# Patient Record
Sex: Male | Born: 1975 | Race: Black or African American | Hispanic: No | Marital: Single | Smoking: Current every day smoker
Health system: Southern US, Community
[De-identification: ages and names within clinical notes are randomized; demographics above are authoritative.]

## PROBLEM LIST (undated history)

## (undated) DIAGNOSIS — T7840XA Allergy, unspecified, initial encounter: Secondary | ICD-10-CM

## (undated) DIAGNOSIS — F329 Major depressive disorder, single episode, unspecified: Secondary | ICD-10-CM

## (undated) DIAGNOSIS — F419 Anxiety disorder, unspecified: Secondary | ICD-10-CM

## (undated) DIAGNOSIS — F319 Bipolar disorder, unspecified: Secondary | ICD-10-CM

## (undated) DIAGNOSIS — F32A Depression, unspecified: Secondary | ICD-10-CM

## (undated) HISTORY — DX: Anxiety disorder, unspecified: F41.9

## (undated) HISTORY — DX: Major depressive disorder, single episode, unspecified: F32.9

## (undated) HISTORY — DX: Allergy, unspecified, initial encounter: T78.40XA

## (undated) HISTORY — DX: Depression, unspecified: F32.A

## (undated) HISTORY — DX: Bipolar disorder, unspecified: F31.9

---

## 2000-12-08 ENCOUNTER — Encounter: Payer: Self-pay | Admitting: Emergency Medicine

## 2000-12-08 ENCOUNTER — Emergency Department (HOSPITAL_COMMUNITY): Admission: EM | Admit: 2000-12-08 | Discharge: 2000-12-08 | Payer: Self-pay | Admitting: Emergency Medicine

## 2006-11-25 ENCOUNTER — Emergency Department (HOSPITAL_COMMUNITY): Admission: EM | Admit: 2006-11-25 | Discharge: 2006-11-25 | Payer: Self-pay | Admitting: Emergency Medicine

## 2010-12-04 ENCOUNTER — Emergency Department (HOSPITAL_COMMUNITY)
Admission: EM | Admit: 2010-12-04 | Discharge: 2010-12-04 | Disposition: A | Discharge status: Home / Self Care | Payer: Self-pay | Attending: Emergency Medicine | Admitting: Emergency Medicine

## 2010-12-04 DIAGNOSIS — F17200 Nicotine dependence, unspecified, uncomplicated: Secondary | ICD-10-CM | POA: Insufficient documentation

## 2010-12-04 DIAGNOSIS — T622X1A Toxic effect of other ingested (parts of) plant(s), accidental (unintentional), initial encounter: Secondary | ICD-10-CM | POA: Insufficient documentation

## 2010-12-04 DIAGNOSIS — M2550 Pain in unspecified joint: Secondary | ICD-10-CM

## 2010-12-04 DIAGNOSIS — L237 Allergic contact dermatitis due to plants, except food: Secondary | ICD-10-CM

## 2010-12-04 DIAGNOSIS — M2540 Effusion, unspecified joint: Secondary | ICD-10-CM | POA: Insufficient documentation

## 2010-12-04 DIAGNOSIS — L255 Unspecified contact dermatitis due to plants, except food: Secondary | ICD-10-CM | POA: Insufficient documentation

## 2010-12-04 MED ORDER — PREDNISONE 10 MG PO TABS: 20.0000 mg | ORAL_TABLET | Freq: Every day | ORAL | Status: AC

## 2010-12-04 MED ORDER — DIPHENHYDRAMINE HCL 25 MG PO CAPS
25.0000 mg | ORAL_CAPSULE | Freq: Once | ORAL | Status: AC
  Administered 2010-12-04: 25 mg via ORAL
  Filled 2010-12-04 (×2): qty 1

## 2010-12-04 MED ORDER — PREDNISONE 20 MG PO TABS
60.0000 mg | ORAL_TABLET | Freq: Once | ORAL | Status: AC
  Administered 2010-12-04: 60 mg via ORAL
  Filled 2010-12-04 (×2): qty 3

## 2010-12-04 NOTE — ED Provider Notes (Signed)
History     CSN: [id number] Arrival date & time: 12/04/2010 12:22 AM  Chief Complaint  Patient presents with  . Generalized Body Aches  . Joint Swelling  . Rash    x 1 week   HPI Comments: Seen 0122.  Patient is a 35 y.o. male presenting with rash. The history is provided by the patient.  Rash  This is a new (Patient working outdoors with exporusre to poison oak/ivy. Developed a rash which was associated with lesions and itching. Has subsequently developed joint tenderness and swelling to wrists, hands, feet. ) problem. The problem has been gradually worsening. The problem is associated with plant contact. There has been no fever. Affected Location: antecubital area on the left, back. The pain is mild. Associated symptoms include blisters, itching and pain.    History reviewed. No pertinent past medical history.  History reviewed. No pertinent past surgical history.  History reviewed. No pertinent family history.  History  Substance Use Topics  . Smoking status: Current Everyday Smoker    Types: Cigarettes  . Smokeless tobacco: Not on file  . Alcohol Use: Yes     occ      Review of Systems  Skin: Positive for itching and rash.  All other systems reviewed and are negative.    Physical Exam  BP 131/79  Pulse 86  Temp(Src) 98.3 F (36.8 C) (Oral)  Resp 20  Ht 6' (1.829 m)  Wt 167 lb (75.751 kg)  BMI 22.65 kg/m2  SpO2 100%  Physical Exam  Nursing note and vitals reviewed. Constitutional: He is oriented to person, place, and time. He appears well-developed and well-nourished. No distress.  HENT:  Head: Normocephalic and atraumatic.  Eyes: EOM are normal.  Neck: Normal range of motion. Neck supple.  Cardiovascular: Normal rate, normal heart sounds and intact distal pulses.   Pulmonary/Chest: Effort normal and breath sounds normal.  Musculoskeletal: Normal range of motion.  Neurological: He is alert and oriented to person, place, and time. He has normal  reflexes.  Skin: Skin is warm and dry.       Resolving rash to left antecubital region. Excoriations to left back. No open weeping lesions. Mild tenderness with RON to wrists, ankles  Psychiatric: He has a normal mood and affect.    ED Course  Procedures  Patient with recent exposure to poison ivy/oak, resolving skin lesions and subsequent  joint tenderness and swelling. Initiated treatment with prednisone. Patient  informed of clinical course, understand medical decision-making process, and agree with plan.Pt stable in ED with no significant deterioration in condition. MDM Reviewed: nursing note and vitals        Nicoletta Dress. Colon Branch, MD 12/04/10 [phone]

## 2010-12-04 NOTE — ED Notes (Signed)
Rash that comes and goes for 1 week, joint swelling that comes and goes for 1 week,

## 2012-06-06 ENCOUNTER — Encounter (HOSPITAL_COMMUNITY): Payer: Self-pay | Admitting: *Deleted

## 2012-06-06 ENCOUNTER — Emergency Department (HOSPITAL_COMMUNITY)
Admission: EM | Admit: 2012-06-06 | Discharge: 2012-06-06 | Disposition: A | Discharge status: Home / Self Care | Payer: Self-pay | Attending: Emergency Medicine | Admitting: Emergency Medicine

## 2012-06-06 DIAGNOSIS — H6692 Otitis media, unspecified, left ear: Secondary | ICD-10-CM

## 2012-06-06 DIAGNOSIS — H6690 Otitis media, unspecified, unspecified ear: Secondary | ICD-10-CM | POA: Insufficient documentation

## 2012-06-06 DIAGNOSIS — F1210 Cannabis abuse, uncomplicated: Secondary | ICD-10-CM | POA: Insufficient documentation

## 2012-06-06 DIAGNOSIS — H9210 Otorrhea, unspecified ear: Secondary | ICD-10-CM | POA: Insufficient documentation

## 2012-06-06 DIAGNOSIS — F17200 Nicotine dependence, unspecified, uncomplicated: Secondary | ICD-10-CM | POA: Insufficient documentation

## 2012-06-06 MED ORDER — AZITHROMYCIN 250 MG PO TABS: ORAL_TABLET | ORAL | Status: DC

## 2012-06-06 NOTE — ED Notes (Signed)
Pt c/o left ear pain since Saturday. Pt denies drainage from ear. Pain becomes worse when laying down. Redness inside ear noted during assessment.

## 2012-06-06 NOTE — ED Provider Notes (Signed)
Medical screening examination/treatment/procedure(s) were performed by non-physician practitioner and as supervising physician I was immediately available for consultation/collaboration.   Joseph L Zammit, MD 06/06/12 1436 

## 2012-06-06 NOTE — ED Provider Notes (Signed)
History     CSN: [id number]  Arrival date & time 06/06/12  1205   First MD Initiated Contact with Patient 06/06/12 1216      Chief Complaint  Patient presents with  . Otalgia    HPI KODY BRANDL is a 37 y.o. male who presents to the ED with ear pain. The pain started 3 days ago. He describes the pain as      History reviewed. No pertinent past medical history.  History reviewed. No pertinent past surgical history.  History reviewed. No pertinent family history.  History  Substance Use Topics  . Smoking status: Current Every Day Smoker    Types: Cigarettes  . Smokeless tobacco: Not on file  . Alcohol Use: Yes     Comment: occ      Review of Systems  Constitutional: Negative for fever and chills.  HENT: Positive for ear pain and congestion. Negative for sore throat, facial swelling and neck pain.   Respiratory: Negative for cough and wheezing.   Cardiovascular: Negative for chest pain.  Gastrointestinal: Negative for nausea, vomiting and abdominal pain.  Genitourinary: Negative for dysuria.  Musculoskeletal: Negative for back pain.  Skin: Negative for rash.  Neurological: Negative for headaches.  Psychiatric/Behavioral: Negative for confusion. The patient is not nervous/anxious.     Allergies  Penicillins  Home Medications  No current outpatient prescriptions on file.  BP 124/82  Pulse 76  Temp(Src) 98.2 F (36.8 C) (Oral)  Resp 18  Ht 6' (1.829 m)  Wt 160 lb (72.576 kg)  BMI 21.7 kg/m2  SpO2 100%  Physical Exam  Nursing note and vitals reviewed. Constitutional: He is oriented to person, place, and time. He appears well-developed and well-nourished. No distress.  HENT:  Head: Normocephalic and atraumatic.  Right Ear: Tympanic membrane normal.  Left Ear: Ear canal normal. Tympanic membrane is erythematous and bulging.  Mouth/Throat: Uvula is midline, oropharynx is clear and moist and mucous membranes are normal.  Eyes: EOM are normal. Pupils are  equal, round, and reactive to light.  Neck: Neck supple.  Cardiovascular: Normal rate and regular rhythm.   Pulmonary/Chest: Effort normal and breath sounds normal.  Abdominal: Soft. There is no tenderness. There is no CVA tenderness.  Musculoskeletal: Normal range of motion. He exhibits no edema.  Neurological: He is alert and oriented to person, place, and time. No cranial nerve deficit.  Skin: Skin is warm and dry.  Psychiatric: He has a normal mood and affect. His behavior is normal. Judgment and thought content normal.   Procedures  Assessment: 37 y.o. male with ear pain   Otitis media left  Plan:  Antibiotics   Follow up with PCP, ibuprofen for discomfort Discussed with the patient and all questioned fully answered.    Medication List    TAKE these medications       azithromycin 250 MG tablet  Commonly known as:  ZITHROMAX  Take 2 tablets now and then one tablet daily            Janne Napoleon, NP 06/06/12 1246

## 2012-06-06 NOTE — ED Notes (Signed)
Pt c/o left earache since Saturday, denies sore throat, fever, cough or N/v/D

## 2012-12-01 ENCOUNTER — Ambulatory Visit (HOSPITAL_COMMUNITY): Payer: Self-pay | Admitting: Psychology

## 2013-01-16 ENCOUNTER — Ambulatory Visit (INDEPENDENT_AMBULATORY_CARE_PROVIDER_SITE_OTHER): Payer: BC Managed Care – PPO | Admitting: Family Medicine

## 2013-01-16 ENCOUNTER — Encounter: Payer: Self-pay | Admitting: Family Medicine

## 2013-01-16 VITALS — BP 120/88 | HR 78 | Temp 98.4°F | Resp 18 | Ht 69.0 in | Wt 161.0 lb

## 2013-01-16 DIAGNOSIS — F319 Bipolar disorder, unspecified: Secondary | ICD-10-CM

## 2013-01-16 DIAGNOSIS — G5691 Unspecified mononeuropathy of right upper limb: Secondary | ICD-10-CM

## 2013-01-16 DIAGNOSIS — Z23 Encounter for immunization: Secondary | ICD-10-CM

## 2013-01-16 DIAGNOSIS — F32A Depression, unspecified: Secondary | ICD-10-CM

## 2013-01-16 DIAGNOSIS — G4700 Insomnia, unspecified: Secondary | ICD-10-CM

## 2013-01-16 DIAGNOSIS — F329 Major depressive disorder, single episode, unspecified: Secondary | ICD-10-CM

## 2013-01-16 DIAGNOSIS — F17200 Nicotine dependence, unspecified, uncomplicated: Secondary | ICD-10-CM

## 2013-01-16 DIAGNOSIS — F411 Generalized anxiety disorder: Secondary | ICD-10-CM | POA: Insufficient documentation

## 2013-01-16 DIAGNOSIS — G5690 Unspecified mononeuropathy of unspecified upper limb: Secondary | ICD-10-CM

## 2013-01-16 DIAGNOSIS — F341 Dysthymic disorder: Secondary | ICD-10-CM

## 2013-01-16 DIAGNOSIS — F419 Anxiety disorder, unspecified: Secondary | ICD-10-CM

## 2013-01-16 MED ORDER — TEMAZEPAM 15 MG PO CAPS: 15.0000 mg | ORAL_CAPSULE | Freq: Every evening | ORAL | Status: DC | PRN

## 2013-01-16 MED ORDER — FLUOXETINE HCL 20 MG PO TABS: 20.0000 mg | ORAL_TABLET | Freq: Every day | ORAL | Status: DC

## 2013-01-16 MED ORDER — LORAZEPAM 0.5 MG PO TABS: 0.5000 mg | ORAL_TABLET | Freq: Two times a day (BID) | ORAL | Status: DC | PRN

## 2013-01-16 NOTE — Assessment & Plan Note (Signed)
Restart temazepam at bedtime 

## 2013-01-16 NOTE — Assessment & Plan Note (Signed)
Start prozac and ativan prn

## 2013-01-16 NOTE — Assessment & Plan Note (Signed)
Discussed tobacco cessation, trying e cig again as he did well with this Flu shot given

## 2013-01-16 NOTE — Assessment & Plan Note (Signed)
Unfortunately we have no way to obtain records, I did discuss with pharmacy his meds He was never on any mood stabilizers  Will start low dose SSRI for his anxiety/depression symptoms

## 2013-01-16 NOTE — Patient Instructions (Addendum)
Start medications as prescribed Take sleeping pill 1 hour before bedtime Start prozac 20 mg  Take ativan .5mg  twice a day as needed F/U 6 weeks

## 2013-01-16 NOTE — Assessment & Plan Note (Signed)
I think he may have some early carpal tunnel, discussed bracing he wants to defer at this time We will monitor for now if this progresses conduction studies

## 2013-01-16 NOTE — Progress Notes (Signed)
  Subjective:    Patient ID: Billy Graham, male    DOB: [birth year], 37 y.o.   MRN: [id number]  HPI  Pt here to establish care. No previous PCP. Was seen by psychiatry Dr. Carroll Sage about 8 months ago, however she closed her practice. Was treated for "explosive Bipolar, anxiety, depression and insomnia" per patient. He was on temazepam, ativan, flexeril for sleep and mood. He was also on a medication given as samples but could not afford and pharmacy has no record of this. He recently split from his wife, told his mood swings were the cause. He feels depressed at times then anxious and angry. He works full time and job is not affected. His sleep is very poor even though he works first shift.  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 Denies any rehab for substances or mood.   Tingling/numbbness in right hand- mostly thumb and index finger sometimes radiates to forearm. No specific injury. Symptoms on and off for the past month. 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 Denies dropping any items.   Review of Systems - per above  GEN- denies fatigue, fever, weight loss,weakness, recent illness HEENT- denies eye drainage, change in vision, nasal discharge, CVS- denies chest pain, palpitations RESP- denies SOB, cough, wheeze ABD- denies N/V, change in stools, abd pain GU- denies dysuria, hematuria, dribbling, incontinence MSK- denies joint pain, muscle aches, injury Neuro- denies headache, dizziness, syncope, seizure activity      Objective:   Physical Exam  GEN- NAD, alert and oriented x3 HEENT- PERRL, EOMI, non injected sclera, pink conjunctiva, MMM, oropharynx clear Neck- Supple, FROM, neg spurlings CVS- RRR, no murmur RESP-CTAB MSK- FROM upper ext, normal inspection, strength equal bilat Neuro- motor equal bilat UE, DTR symmetric, sensation grossly in tact, neg phalens, neg tinels EXT- No  edema Pulses- Radial 2+ PSYCH- normal affect and mood, no hallucinations, good eye contact        Assessment & Plan:

## 2013-02-27 ENCOUNTER — Encounter: Payer: Self-pay | Admitting: Family Medicine

## 2013-02-27 ENCOUNTER — Ambulatory Visit (INDEPENDENT_AMBULATORY_CARE_PROVIDER_SITE_OTHER): Payer: BC Managed Care – PPO | Admitting: Family Medicine

## 2013-02-27 ENCOUNTER — Ambulatory Visit: Payer: BC Managed Care – PPO | Admitting: Family Medicine

## 2013-02-27 VITALS — BP 110/80 | HR 68 | Temp 97.7°F | Resp 18 | Ht 70.5 in | Wt 162.0 lb

## 2013-02-27 DIAGNOSIS — F32A Depression, unspecified: Secondary | ICD-10-CM

## 2013-02-27 DIAGNOSIS — G4700 Insomnia, unspecified: Secondary | ICD-10-CM

## 2013-02-27 DIAGNOSIS — F329 Major depressive disorder, single episode, unspecified: Secondary | ICD-10-CM

## 2013-02-27 DIAGNOSIS — F341 Dysthymic disorder: Secondary | ICD-10-CM

## 2013-02-27 MED ORDER — FLUOXETINE HCL 40 MG PO CAPS: 40.0000 mg | ORAL_CAPSULE | Freq: Every day | ORAL | Status: DC

## 2013-02-27 MED ORDER — TEMAZEPAM 30 MG PO CAPS: 30.0000 mg | ORAL_CAPSULE | Freq: Every evening | ORAL | Status: DC | PRN

## 2013-02-27 MED ORDER — ALPRAZOLAM 0.5 MG PO TABS: 0.5000 mg | ORAL_TABLET | Freq: Two times a day (BID) | ORAL | Status: DC | PRN

## 2013-02-27 NOTE — Progress Notes (Signed)
  Subjective:    Patient ID: Billy Graham, male    DOB: July 15, 1975, 37 y.o.   MRN: [id number]  HPI Patient here for six-week interim followup on medications. He has a history of anxiety and depression as well as possible underlying bipolar disorder. He was started on Prozac 20 mg he states this is helping he continues to have difficulties with anxiety. He was also given Restoril for sleep which she has used in the past as well as Ativan. His sleep has not improved he does not feel like the medication is strong enough. Regarding his anxiety attacks and anger issues the Ativan which he was on in the past did not help at all.   Review of Systems - per above  GEN- denies fatigue, fever, weight loss,weakness, recent illness       Objective:   Physical Exam  GEN-NAD,alert and oriented x 3 Psych- normal affect and mood PHQ-9 -- 7 GAD -7 12     Assessment & Plan:

## 2013-02-27 NOTE — Assessment & Plan Note (Addendum)
Increase Prozac to 40 mg day Will change him to Xanax 0.5 mg twice a day and see how he does with this as we titrate up his Prozac

## 2013-02-27 NOTE — Assessment & Plan Note (Signed)
Increases temazepam to 30 mg another option will be trazodone or seroquel

## 2013-02-27 NOTE — Patient Instructions (Signed)
Prozac increasaed to 40mg  Change to xanax 0.5t wice a day as needed Temazepam increased to 30mg  ( for sleep) F/U 8 weeks

## 2013-03-19 ENCOUNTER — Telehealth: Payer: Self-pay | Admitting: Family Medicine

## 2013-03-19 NOTE — Telephone Encounter (Signed)
noted 

## 2013-03-19 NOTE — Telephone Encounter (Signed)
Pt ran out of xanax 3 days early and wanted to let us know   Call back [phone]

## 2013-04-27 ENCOUNTER — Telehealth: Payer: Self-pay | Admitting: Family Medicine

## 2013-04-27 MED ORDER — TEMAZEPAM 30 MG PO CAPS: 30.0000 mg | ORAL_CAPSULE | Freq: Every evening | ORAL | Status: DC | PRN

## 2013-04-27 MED ORDER — ALPRAZOLAM 0.5 MG PO TABS: 0.5000 mg | ORAL_TABLET | Freq: Two times a day (BID) | ORAL | Status: DC | PRN

## 2013-04-27 NOTE — Telephone Encounter (Signed)
RX called in .

## 2013-04-27 NOTE — Telephone Encounter (Signed)
Okay to refill? 

## 2013-04-27 NOTE — Telephone Encounter (Signed)
Pt is needing a refill on his Restoril and Xanax Call back number is [phone]

## 2013-04-27 NOTE — Telephone Encounter (Signed)
?   Ok to refill;last rf 03/29/13;lov11/25/14

## 2013-06-20 ENCOUNTER — Other Ambulatory Visit: Payer: Self-pay | Admitting: Family Medicine

## 2013-06-20 NOTE — Telephone Encounter (Signed)
Give 30 day supply only needs an appointment before any further refills

## 2013-06-20 NOTE — Telephone Encounter (Signed)
Medication called to pharmacy. 

## 2013-06-20 NOTE — Telephone Encounter (Signed)
Ok to refill??  Last office visit 112/24/2014.  Last refill 04/27/2013.

## 2013-07-10 ENCOUNTER — Ambulatory Visit (INDEPENDENT_AMBULATORY_CARE_PROVIDER_SITE_OTHER): Payer: BC Managed Care – PPO | Admitting: Family Medicine

## 2013-07-10 ENCOUNTER — Encounter: Payer: Self-pay | Admitting: Family Medicine

## 2013-07-10 VITALS — BP 118/60 | HR 64 | Temp 98.2°F | Resp 12 | Ht 72.0 in | Wt 171.0 lb

## 2013-07-10 DIAGNOSIS — Z0000 Encounter for general adult medical examination without abnormal findings: Secondary | ICD-10-CM | POA: Insufficient documentation

## 2013-07-10 DIAGNOSIS — G4700 Insomnia, unspecified: Secondary | ICD-10-CM

## 2013-07-10 DIAGNOSIS — F419 Anxiety disorder, unspecified: Secondary | ICD-10-CM

## 2013-07-10 DIAGNOSIS — F319 Bipolar disorder, unspecified: Secondary | ICD-10-CM

## 2013-07-10 DIAGNOSIS — Z111 Encounter for screening for respiratory tuberculosis: Secondary | ICD-10-CM

## 2013-07-10 DIAGNOSIS — Z0184 Encounter for antibody response examination: Secondary | ICD-10-CM

## 2013-07-10 DIAGNOSIS — F341 Dysthymic disorder: Secondary | ICD-10-CM

## 2013-07-10 DIAGNOSIS — F32A Depression, unspecified: Secondary | ICD-10-CM

## 2013-07-10 DIAGNOSIS — F17200 Nicotine dependence, unspecified, uncomplicated: Secondary | ICD-10-CM

## 2013-07-10 DIAGNOSIS — F329 Major depressive disorder, single episode, unspecified: Secondary | ICD-10-CM

## 2013-07-10 DIAGNOSIS — Z23 Encounter for immunization: Secondary | ICD-10-CM

## 2013-07-10 LAB — CBC WITH DIFFERENTIAL/PLATELET
Basophils Absolute: 0 10*3/uL (ref 0.0–0.1)
Basophils Relative: 0 % (ref 0–1)
EOS ABS: 0.2 10*3/uL (ref 0.0–0.7)
Eosinophils Relative: 4 % (ref 0–5)
HCT: 41.6 % (ref 39.0–52.0)
HEMOGLOBIN: 14.1 g/dL (ref 13.0–17.0)
LYMPHS ABS: 1.9 10*3/uL (ref 0.7–4.0)
LYMPHS PCT: 31 % (ref 12–46)
MCH: 29.6 pg (ref 26.0–34.0)
MCHC: 33.9 g/dL (ref 30.0–36.0)
MCV: 87.2 fL (ref 78.0–100.0)
MONOS PCT: 9 % (ref 3–12)
Monocytes Absolute: 0.6 10*3/uL (ref 0.1–1.0)
NEUTROS PCT: 56 % (ref 43–77)
Neutro Abs: 3.5 10*3/uL (ref 1.7–7.7)
PLATELETS: 213 10*3/uL (ref 150–400)
RBC: 4.77 MIL/uL (ref 4.22–5.81)
RDW: 15.3 % (ref 11.5–15.5)
WBC: 6.2 10*3/uL (ref 4.0–10.5)

## 2013-07-10 LAB — LIPID PANEL
CHOL/HDL RATIO: 3.3 ratio
CHOLESTEROL: 161 mg/dL (ref 0–200)
HDL: 49 mg/dL (ref >39)
LDL Cholesterol: 101 mg/dL — ABNORMAL HIGH (ref 0–99)
Triglycerides: 53 mg/dL (ref <150)
VLDL: 11 mg/dL (ref 0–40)

## 2013-07-10 LAB — COMPREHENSIVE METABOLIC PANEL
ALBUMIN: 4.1 g/dL (ref 3.5–5.2)
ALK PHOS: 47 U/L (ref 39–117)
ALT: 24 U/L (ref 0–53)
AST: 17 U/L (ref 0–37)
BUN: 9 mg/dL (ref 6–23)
CO2: 26 mEq/L (ref 19–32)
CREATININE: 0.86 mg/dL (ref 0.50–1.35)
Calcium: 9.2 mg/dL (ref 8.4–10.5)
Chloride: 107 mEq/L (ref 96–112)
GLUCOSE: 80 mg/dL (ref 70–99)
POTASSIUM: 4.1 meq/L (ref 3.5–5.3)
Sodium: 140 mEq/L (ref 135–145)
Total Bilirubin: 0.7 mg/dL (ref 0.2–1.2)
Total Protein: 6.3 g/dL (ref 6.0–8.3)

## 2013-07-10 NOTE — Assessment & Plan Note (Signed)
Continue to work on tobacco cessation.  

## 2013-07-10 NOTE — Assessment & Plan Note (Signed)
Doing well on meds, no changes

## 2013-07-10 NOTE — Progress Notes (Signed)
Patient ID: Billy ChangChristopher S Graham, male   DOB: 12-Jan-1976, 38 y.o.   MRN: [id number]   Subjective:    Patient ID: Billy Changhristopher S Graham, male    DOB: 12-Jan-1976, 38 y.o.   MRN: [id number]  Patient presents for F/U from Nov  patient here for physical exam. He also has some forms for his work. He will be working for [employer] [place] and needs immunization titers as well as PPD placement and tetanus booster. He is no specific concerns. He is taking his medications as prescribed and he feels much better with his mood. He is sleeping fairly well with the temazepam. He states that his mood has been good with the Prozac and his wife notices a difference.  He is due for fasting labs.    Review Of Systems:  GEN- denies fatigue, fever, weight loss,weakness, recent illness HEENT- denies eye drainage, change in vision, nasal discharge, CVS- denies chest pain, palpitations RESP- denies SOB, cough, wheeze ABD- denies N/V, change in stools, abd pain GU- denies dysuria, hematuria, dribbling, incontinence MSK- denies joint pain, muscle aches, injury Neuro- denies headache, dizziness, syncope, seizure activity       Objective:    BP 118/60  Pulse 64  Temp(Src) 98.2 F (36.8 C) (Oral)  Resp 12  Ht 6' (1.829 m)  Wt 171 lb (77.565 kg)  BMI 23.19 kg/m2 GEN- NAD, alert and oriented x3 HEENT- PERRL, EOMI, non injected sclera, pink conjunctiva, MMM, oropharynx clear, TM clear bilat Neck- Supple, CVS- RRR, no murmur RESP-CTAB ABD-NABS,soft,NT,ND EXT- No edema Pulses- Radial, DP- 2+ MSK- FROM Upper and Lower EXT Neuro- CNII-XII in tact, no focal deficits, DTR symmetric , normal muscle tone Psych- normal affect and mood       Assessment & Plan:      Problem List Items Addressed This Visit   None      Note: This dictation was prepared with Dragon dictation along with smaller phrase technology. Any transcriptional errors that result from this process are unintentional.

## 2013-07-10 NOTE — Patient Instructions (Signed)
We will call with results Return in 48 hours to have PPD read by nurse Medications refilled F/U 4 months

## 2013-07-10 NOTE — Assessment & Plan Note (Signed)
Mood has been stable, he is not on a mood stabilizer, does not appear manic with use of SSRI

## 2013-07-10 NOTE — Assessment & Plan Note (Signed)
Stable on meds, no change 

## 2013-07-10 NOTE — Assessment & Plan Note (Signed)
CPE done, TDAP given, PPD for work Titers drawn for immunity status FLP

## 2013-07-11 ENCOUNTER — Other Ambulatory Visit: Payer: Self-pay | Admitting: Family Medicine

## 2013-07-11 LAB — VARICELLA ZOSTER ANTIBODY, IGG: Varicella IgG: 3595 Index — ABNORMAL HIGH (ref <135.00)

## 2013-07-11 LAB — MEASLES/MUMPS/RUBELLA IMMUNITY
MUMPS IGG: 179 [AU]/ml — AB (ref <9.00)
RUBEOLA IGG: 27.3 [AU]/ml — AB (ref <25.00)
Rubella: 1.75 Index — ABNORMAL HIGH (ref <0.90)

## 2013-07-11 LAB — HEPATITIS B SURFACE ANTIBODY,QUALITATIVE: Hep B S Ab: NEGATIVE

## 2013-07-12 ENCOUNTER — Ambulatory Visit: Payer: BC Managed Care – PPO | Admitting: *Deleted

## 2013-07-12 DIAGNOSIS — Z111 Encounter for screening for respiratory tuberculosis: Secondary | ICD-10-CM

## 2013-07-12 LAB — TB SKIN TEST
INDURATION: 0 mm
TB Skin Test: NEGATIVE

## 2013-07-12 NOTE — Telephone Encounter (Signed)
Ok to refill??  Last office visit 07/10/2013.  Last refill 06/20/2013.

## 2013-07-12 NOTE — Progress Notes (Signed)
Patient ID: Billy Graham, male   DOB: January 03, 1976, 38 y.o.   MRN: [id number] Patient seen in clinic today to have TB skin test read.   Noted test site negative.

## 2013-07-13 NOTE — Telephone Encounter (Signed)
Okay to refill, change to 60 tablets, give 2 refills

## 2013-07-13 NOTE — Telephone Encounter (Signed)
Medication called to pharmacy.  Call placed to patient and patient made aware.  

## 2013-09-09 ENCOUNTER — Other Ambulatory Visit: Payer: Self-pay | Admitting: Family Medicine

## 2013-09-10 NOTE — Telephone Encounter (Signed)
Medication called to pharmacy. 

## 2013-09-10 NOTE — Telephone Encounter (Signed)
Ok to refill??  Last office visit 07/10/2013.  Last refill 04/27/2013.

## 2013-09-10 NOTE — Telephone Encounter (Signed)
Okay to refill? 

## 2013-10-08 ENCOUNTER — Other Ambulatory Visit: Payer: Self-pay | Admitting: Family Medicine

## 2013-10-08 NOTE — Telephone Encounter (Signed)
Okay to refill? 

## 2013-10-08 NOTE — Telephone Encounter (Signed)
Medication called to pharmacy. 

## 2013-10-08 NOTE — Telephone Encounter (Signed)
Ok to refill??  Last office visit 07/10/2013.  Last refill 07/13/2013, #2 refills.

## 2013-11-09 ENCOUNTER — Ambulatory Visit: Payer: BC Managed Care – PPO | Admitting: Family Medicine

## 2013-12-03 ENCOUNTER — Ambulatory Visit: Payer: BC Managed Care – PPO | Admitting: Family Medicine

## 2013-12-25 ENCOUNTER — Encounter: Payer: Self-pay | Admitting: Family Medicine

## 2013-12-25 ENCOUNTER — Ambulatory Visit (INDEPENDENT_AMBULATORY_CARE_PROVIDER_SITE_OTHER): Payer: BC Managed Care – PPO | Admitting: Family Medicine

## 2013-12-25 VITALS — BP 118/70 | HR 78 | Temp 98.2°F | Resp 16 | Ht 72.44 in | Wt 177.0 lb

## 2013-12-25 DIAGNOSIS — J309 Allergic rhinitis, unspecified: Secondary | ICD-10-CM | POA: Insufficient documentation

## 2013-12-25 DIAGNOSIS — F329 Major depressive disorder, single episode, unspecified: Secondary | ICD-10-CM

## 2013-12-25 DIAGNOSIS — F341 Dysthymic disorder: Secondary | ICD-10-CM

## 2013-12-25 DIAGNOSIS — G4700 Insomnia, unspecified: Secondary | ICD-10-CM

## 2013-12-25 DIAGNOSIS — F319 Bipolar disorder, unspecified: Secondary | ICD-10-CM

## 2013-12-25 DIAGNOSIS — B353 Tinea pedis: Secondary | ICD-10-CM | POA: Insufficient documentation

## 2013-12-25 DIAGNOSIS — J3089 Other allergic rhinitis: Secondary | ICD-10-CM

## 2013-12-25 DIAGNOSIS — F32A Depression, unspecified: Secondary | ICD-10-CM

## 2013-12-25 DIAGNOSIS — Z23 Encounter for immunization: Secondary | ICD-10-CM

## 2013-12-25 DIAGNOSIS — F419 Anxiety disorder, unspecified: Principal | ICD-10-CM

## 2013-12-25 MED ORDER — FLUTICASONE PROPIONATE 50 MCG/ACT NA SUSP: 2.0000 | Freq: Every day | NASAL | Status: DC

## 2013-12-25 MED ORDER — TEMAZEPAM 30 MG PO CAPS: ORAL_CAPSULE | ORAL | Status: DC

## 2013-12-25 MED ORDER — ALPRAZOLAM 0.5 MG PO TABS: ORAL_TABLET | ORAL | Status: DC

## 2013-12-25 MED ORDER — FLUOXETINE HCL 40 MG PO CAPS: 40.0000 mg | ORAL_CAPSULE | Freq: Every day | ORAL | Status: DC

## 2013-12-25 MED ORDER — TERBINAFINE HCL 250 MG PO TABS: 250.0000 mg | ORAL_TABLET | Freq: Every day | ORAL | Status: DC

## 2013-12-25 NOTE — Assessment & Plan Note (Signed)
His mood is well-controlled when he is on his Prozac all restart this along with the Xanax which he did well with this combination. He has not required any hospitalizations. He has some support from his family with regards to the passing of his father

## 2013-12-25 NOTE — Progress Notes (Signed)
Patient ID: Billy Graham, male   DOB: 10-21-1975, 38 y.o.   MRN: [id number]   Subjective:    Patient ID: Billy Graham, male    DOB: [birth year], 38 y.o.   MRN: [id number]  Patient presents for L Ear Infection and Medication Review and Refill  patient here to followup medications. He was being treated for anxiety as well as bipolar disorder and insomnia. He's been off this medication for about the past 3-1/2 weeks. His job to take him out of town very often therefore he missed his last appointment. He has had some increased stress recently her father passed away in Dec 24, 2022 for metastatic cancer. His family has taken is very hard.  He complains of sinus drainage but denies any color to the drainage is mostly down the back of his throat. He also gets some sneezing with it and some ear discomfort. He's not had any fever or significant cough.  Peeling skin on his feet which is been there for quite some time he is also concerned about the color of his nails they've always been dark and this is been present for years. He has tried over-the-counter Lamisil and Lotrimin without any improvement in the scaliness of his feet    Review Of Systems:  GEN- denies fatigue, fever, weight loss,weakness, recent illness HEENT- denies eye drainage, change in vision, nasal discharge, CVS- denies chest pain, palpitations RESP- denies SOB, cough, wheeze ABD- denies N/V, change in stools, abd pain GU- denies dysuria, hematuria, dribbling, incontinence MSK- denies joint pain, muscle aches, injury Neuro- denies headache, dizziness, syncope, seizure activity       Objective:    BP 118/70  Pulse 78  Temp(Src) 98.2 F (36.8 C) (Oral)  Resp 16  Ht 6' 0.44" (1.84 m)  Wt 177 lb (80.287 kg)  BMI 23.71 kg/m2 GEN- NAD, alert and oriented x3 HEENT- PERRL, EOMI, non injected sclera, pink conjunctiva, MMM, oropharynx clear, nares clear rhinorrhea, enlarged turbinates, TM clear bialt canal clear CVS- RRR, no  murmur RESP-CTAB Psych- normal affect and mood Skin- dry scaley skin on heels and lateral aspects of feet, mild moisture between web spaces, hyperpigmented stria in right great toenail and 5th nail, no yellow discoloration no brittle nail EXT- No edema Pulses- Radial, DP- 2+        Assessment & Plan:      Problem List Items Addressed This Visit   Tinea pedis   Relevant Medications      terbinafine (LAMISIL) 250 MG tablet   Insomnia   Bipolar disorder, unspecified   Anxiety and depression   Allergic rhinitis - Primary    Other Visit Diagnoses   Need for prophylactic vaccination and inoculation against influenza           Note: This dictation was prepared with Dragon dictation along with smaller phrase technology. Any transcriptional errors that result from this process are unintentional.

## 2013-12-25 NOTE — Assessment & Plan Note (Signed)
Restart temazepam at bedtime

## 2013-12-25 NOTE — Patient Instructions (Addendum)
Restart medications Flu shot given Use nasal spray for the sinus drainage Take pill for athletes foot as directed, use gold bonds powder F/U 4 months

## 2013-12-25 NOTE — Assessment & Plan Note (Signed)
We'll treat with Flonase 

## 2013-12-25 NOTE — Assessment & Plan Note (Signed)
Failed topical treatments we'll try him on terbinafine once a day for the next 2 weeks regarding the hyperpigmented nails I think this is due to the hyperpigmentation of his skin there is no sign of any fungus

## 2014-03-06 ENCOUNTER — Ambulatory Visit: Payer: BC Managed Care – PPO | Admitting: Family Medicine

## 2014-03-26 ENCOUNTER — Other Ambulatory Visit: Payer: Self-pay | Admitting: Family Medicine

## 2014-03-26 NOTE — Telephone Encounter (Signed)
Medication called to pharmacy. 

## 2014-03-26 NOTE — Telephone Encounter (Signed)
Ok to refill??  Last office visit/ refill 12/25/2013, #2 refills.

## 2014-03-26 NOTE — Telephone Encounter (Signed)
Okay to refill? 

## 2014-03-30 ENCOUNTER — Other Ambulatory Visit: Payer: Self-pay | Admitting: Family Medicine

## 2014-03-31 NOTE — Telephone Encounter (Signed)
Ok to refill?  Last CMP 07/10/2013.

## 2014-04-29 ENCOUNTER — Ambulatory Visit: Payer: Self-pay | Admitting: Family Medicine

## 2014-04-29 ENCOUNTER — Encounter: Payer: Self-pay | Admitting: Family Medicine

## 2014-05-31 ENCOUNTER — Telehealth: Payer: Self-pay | Admitting: Family Medicine

## 2014-05-31 NOTE — Telephone Encounter (Signed)
Patient is calling to get bills for hisself, he said that his employer is supposed to be paying them  [phone]

## 2014-06-05 ENCOUNTER — Encounter: Payer: Self-pay | Admitting: Family Medicine

## 2014-06-05 NOTE — Telephone Encounter (Signed)
I have left msg for patient to return my call.

## 2014-06-21 ENCOUNTER — Telehealth: Payer: Self-pay | Admitting: Family Medicine

## 2014-06-21 NOTE — Telephone Encounter (Signed)
He has been stable on meds, okay to refill for him, he has contract work

## 2014-06-21 NOTE — Telephone Encounter (Signed)
Call placed to patient.   Last OV noted in 12/2013.  Reports that his medication is effective, and he does not want to change anything. Reports that [place] will be working in [place] year, and will only get 1-2 days where he will be able to return to town to try to schedule appointment.   Requested that when patient requires refill that he contact office with the name and phone number of pharmacy [facility]   MD to be made aware.

## 2014-06-21 NOTE — Telephone Encounter (Signed)
[phone]  PT has called he is in town this weekend. He will be out of town working for a year and he is wanting to come in and see dr Jeanice Limdurham. I told him that she did not have anything available today and that I would send a message back to her nurse to see if there was anything that could be done. He states that he does want to speak to the nurse or dr Jeanice Limdurham about his medications.

## 2014-06-24 NOTE — Telephone Encounter (Signed)
Tried calling patient no answer. I did look at his guarantor snapshot and I don't see that patient has any balance. I will wait for patient to return my call.

## 2014-07-19 ENCOUNTER — Other Ambulatory Visit: Payer: Self-pay | Admitting: Family Medicine

## 2014-07-19 NOTE — Telephone Encounter (Signed)
LRF Restoril 12/25/13 #30 + 3  LRF Alprazolam 03/26/14 #60 + 2  LOV 12/25/13  OK refill?

## 2014-07-19 NOTE — Telephone Encounter (Signed)
Okay to refill? 

## 2014-07-22 NOTE — Telephone Encounter (Signed)
rx's called in . 

## 2014-09-13 ENCOUNTER — Other Ambulatory Visit: Payer: Self-pay | Admitting: Family Medicine

## 2014-09-16 NOTE — Telephone Encounter (Signed)
Medication filled x1 with no refills.   Requires office visit before any further refills can be given.   Letter sent.  

## 2015-03-19 ENCOUNTER — Encounter: Payer: Self-pay | Admitting: Family Medicine

## 2015-03-19 ENCOUNTER — Ambulatory Visit (INDEPENDENT_AMBULATORY_CARE_PROVIDER_SITE_OTHER): Payer: BLUE CROSS/BLUE SHIELD | Admitting: Family Medicine

## 2015-03-19 VITALS — BP 128/62 | HR 74 | Temp 98.5°F | Resp 18 | Ht 72.0 in | Wt 179.0 lb

## 2015-03-19 DIAGNOSIS — Z23 Encounter for immunization: Secondary | ICD-10-CM

## 2015-03-19 DIAGNOSIS — G4700 Insomnia, unspecified: Secondary | ICD-10-CM

## 2015-03-19 DIAGNOSIS — F418 Other specified anxiety disorders: Secondary | ICD-10-CM

## 2015-03-19 DIAGNOSIS — F32A Depression, unspecified: Secondary | ICD-10-CM

## 2015-03-19 DIAGNOSIS — F329 Major depressive disorder, single episode, unspecified: Secondary | ICD-10-CM

## 2015-03-19 DIAGNOSIS — F419 Anxiety disorder, unspecified: Principal | ICD-10-CM

## 2015-03-19 MED ORDER — ALPRAZOLAM 0.5 MG PO TABS: 0.5000 mg | ORAL_TABLET | Freq: Two times a day (BID) | ORAL | Status: DC | PRN

## 2015-03-19 MED ORDER — FLUOXETINE HCL 40 MG PO CAPS: 40.0000 mg | ORAL_CAPSULE | Freq: Every day | ORAL | Status: DC

## 2015-03-19 MED ORDER — TEMAZEPAM 30 MG PO CAPS: ORAL_CAPSULE | ORAL | Status: DC

## 2015-03-19 NOTE — Addendum Note (Signed)
Addended by: Phillips OdorSIX, Alizay Bronkema H on: 03/19/2015 03:06 PM   Modules accepted: Orders

## 2015-03-19 NOTE — Patient Instructions (Signed)
Take Multivitamin Take Vitamin C  Delsym for cough  Vicks for any congestion  Continue current medication F/U 1 year for Physical

## 2015-03-19 NOTE — Progress Notes (Signed)
Patient ID: Billy Graham, male   DOB: 08-May-1975, 39 y.o.   MRN: [id number]   Subjective:    Patient ID: Billy Graham, male    DOB: 08-May-1975, 39 y.o.   MRN: [id number]  Patient presents for Medicaiton Review/ Refill He does a lot of contract work  In [place]- [place] was sent to [place] [employer] now he is back brace for for the next 6 months but they're planning to send [place] to [place] long[place]term. He has history of anxiety and depression as well as insomnia. He was also given a diagnosis of bipolar in the past. His lungs he is on his medications his symptoms are very well controlled. He does admit that with the holidays and the multiple jobs that he is working on that he's had some problems focusing but overall is doing very well on the job. He has no new concerns today. He did ask when he could take as needed for cough and congestion when he comes up.    Review Of Systems:  GEN- denies fatigue, fever, weight loss,weakness, recent illness HEENT- denies eye drainage, change in vision, nasal discharge, CVS- denies chest pain, palpitations RESP- denies SOB, cough, wheeze ABD- denies N/V, change in stools, abd pain GU- denies dysuria, hematuria, dribbling, incontinence MSK- denies joint pain, muscle aches, injury Neuro- denies headache, dizziness, syncope, seizure activity       Objective:    BP 128/62 mmHg  Pulse 74  Temp(Src) 98.5 F (36.9 C) (Oral)  Resp 18  Ht 6' (1.829 m)  Wt 179 lb (81.194 kg)  BMI 24.27 kg/m2 GEN- NAD, alert and oriented x3 HEENT- PERRL, EOMI, non injected sclera, pink conjunctiva, MMM, oropharynx clear CVS- RRR, no murmur RESP-CTAB Psych- normal affect and mood EXT- No edema Pulses- Radial - 2+        Assessment & Plan:      Problem List Items Addressed This Visit    Insomnia   Anxiety and depression - Primary      Note: This dictation was prepared with Dragon dictation along with smaller phrase technology. Any transcriptional  errors that result from this process are unintentional.

## 2015-03-19 NOTE — Assessment & Plan Note (Addendum)
We'll continue his Prozac as well as temazepam for sleep and Xanax as needed. No signs of any abuse of the medication. His symptoms are very well controlled with this regimen. 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 I would not recommend adding any other medications in such as stimulants. 15 minutes with patient greater than 50% spent on counseling and medication management

## 2015-04-23 ENCOUNTER — Ambulatory Visit (INDEPENDENT_AMBULATORY_CARE_PROVIDER_SITE_OTHER): Payer: BLUE CROSS/BLUE SHIELD | Admitting: Physician Assistant

## 2015-04-23 ENCOUNTER — Encounter: Payer: Self-pay | Admitting: Physician Assistant

## 2015-04-23 VITALS — BP 134/90 | HR 64 | Temp 98.2°F | Resp 18 | Wt 179.0 lb

## 2015-04-23 DIAGNOSIS — J029 Acute pharyngitis, unspecified: Secondary | ICD-10-CM | POA: Diagnosis not present

## 2015-04-23 DIAGNOSIS — H66002 Acute suppurative otitis media without spontaneous rupture of ear drum, left ear: Secondary | ICD-10-CM | POA: Diagnosis not present

## 2015-04-23 MED ORDER — AZITHROMYCIN 250 MG PO TABS: ORAL_TABLET | ORAL | Status: DC

## 2015-04-23 NOTE — Progress Notes (Signed)
    Patient ID: Billy Graham MRN: [id number], DOB: 12-14-1975, 40 y.o. Date of Encounter: 04/23/2015, 12:07 PM    Chief Complaint:  Chief Complaint  Patient presents with  . sick x 3 days    sore throat, ear ache, body aches     HPI: 40 y.o. year old AA male presents with above symptoms. Says that on Sunday (04/20/15) he vomited about 3 times. Says that he has not vomited anymore that he has continued to feel little nauseous. Also feels like he has some heartburn. Has had no diarrhea. Also he says that his left ear has been hurting with a really bad achy pain for 3 days.  Also now with sore throat and says he just feels weak in general. Says that he is not really getting much drainage out of his nose and not much congestion in his chest or cough. No known fevers or chills.     Home Meds:   Outpatient Prescriptions Prior to Visit  Medication Sig Dispense Refill  . ALPRAZolam (XANAX) 0.5 MG tablet Take 1 tablet (0.5 mg total) by mouth 2 (two) times daily as needed. 60 tablet 3  . FLUoxetine (PROZAC) 40 MG capsule Take 1 capsule (40 mg total) by mouth daily. 30 capsule 11  . temazepam (RESTORIL) 30 MG capsule TAKE 1 CAPSULE BY MOUTH EVERY DAY AT BEDTIME- MUST WAIT 30 DAYS BETWEEN REFILLS 30 capsule 5   No facility-administered medications prior to visit.    Allergies:  Allergies  Allergen Reactions  . Penicillins     Can't remember reaction      Review of Systems: See HPI for pertinent ROS. All other ROS negative.    Physical Exam: Blood pressure 134/90, pulse 64, temperature 98.2 F (36.8 C), temperature source Oral, resp. rate 18, weight 179 lb (81.194 kg)., Body mass index is 24.27 kg/(m^2). General:  WNWD AAM. Appears in no acute distress. HEENT: Normocephalic, atraumatic, eyes without discharge, sclera non-icteric, nares are without discharge. Bilateral auditory canals clear, TM's are without perforation. Left TM with diffuse red erythema. Right TM clear.  Oral  cavity moist, posterior pharynx with mild erythema, no exudate, no peritonsillar abscess.  Neck: Supple. No thyromegaly. No lymphadenopathy. Lungs: Clear bilaterally to auscultation without wheezes, rales, or rhonchi. Breathing is unlabored. Heart: Regular rhythm. No murmurs, rubs, or gallops. Abdomen: Normal bowel sounds. No area of significant tenderness with palpation. No mass or organomegaly. Msk:  Strength and tone normal for age. Extremities/Skin: Warm and dry. Neuro: Alert and oriented X 3. Moves all extremities spontaneously. Gait is normal. CNII-XII grossly in tact. Psych:  Responds to questions appropriately with a normal affect.   Rapid strep test ---Negative   ASSESSMENT AND PLAN:  40 y.o. year old male with  1. Acute suppurative otitis media of left ear without spontaneous rupture of tympanic membrane, recurrence not specified Penicillin allergy. Therefore we'll treat with azithromycin. He is to take azithromycin as directed. Started as immediately as possible. Can use Tylenol and Motrin for pain relief. Follow-up if symptoms not improved in 48 hours or if they do not resolve within 1 week after completion of antibiotic. - azithromycin (ZITHROMAX) 250 MG tablet; Day 1: Take 2 daily. Days 2-5: Take 1 daily.  Dispense: 6 tablet; Refill: 0  2. Sorethroat - Rapid strep screen (not at [facility])   Signed, [facility] [place], [place], [facility] 04/23/2015 12:07 PM

## 2015-05-07 LAB — STREP GROUP A AG, W/REFLEX TO CULT: STREGTOCOCCUS GROUP A AG SCREEN: NOT DETECTED

## 2015-05-07 NOTE — Addendum Note (Signed)
Addended by: Alean Rinne A on: 05/07/2015 03:48 PM   Modules accepted: Orders

## 2015-05-30 ENCOUNTER — Telehealth: Payer: Self-pay | Admitting: Family Medicine

## 2015-05-30 NOTE — Telephone Encounter (Signed)
Called pt told [employer].  He asked for appt Tuesday morning, he will be in [place] then.

## 2015-05-30 NOTE — Telephone Encounter (Signed)
I did not see him, is he able to come in for a recheck on the ear? This afternoon.  If not try Sudafed to decongest, add Omnicef  BID x 7 days

## 2015-05-30 NOTE — Telephone Encounter (Signed)
Ear is still very bothersome and vertigo is bad.  Ear not painful but feels plugged and is popping.  Feels he needs something else.  Please advise?

## 2015-06-03 ENCOUNTER — Ambulatory Visit (INDEPENDENT_AMBULATORY_CARE_PROVIDER_SITE_OTHER): Payer: BLUE CROSS/BLUE SHIELD | Admitting: Family Medicine

## 2015-06-03 ENCOUNTER — Encounter: Payer: Self-pay | Admitting: Family Medicine

## 2015-06-03 VITALS — BP 128/70 | HR 68 | Temp 98.3°F | Resp 14 | Ht 72.0 in | Wt 178.0 lb

## 2015-06-03 DIAGNOSIS — H6692 Otitis media, unspecified, left ear: Secondary | ICD-10-CM

## 2015-06-03 DIAGNOSIS — M25462 Effusion, left knee: Secondary | ICD-10-CM | POA: Diagnosis not present

## 2015-06-03 MED ORDER — MELOXICAM 7.5 MG PO TABS: 7.5000 mg | ORAL_TABLET | Freq: Every day | ORAL | Status: DC

## 2015-06-03 MED ORDER — CEFDINIR 300 MG PO CAPS
300.0000 mg | ORAL_CAPSULE | Freq: Two times a day (BID) | ORAL | Status: DC
Start: 2015-06-03 — End: 2020-02-19

## 2015-06-03 NOTE — Patient Instructions (Signed)
Take meloxicam for 2 weeks, with food Take antibiotics for infection Use zyrtec or sudafed  F/U as needed

## 2015-06-03 NOTE — Progress Notes (Signed)
Patient ID: Billy Graham, male   DOB: [birth year], 40 y.o.   MRN: [id number]   Subjective:    Patient ID: Billy Graham, male    DOB: February 28, 1976, 39 y.o.   MRN: [id number]  Patient presents for L Ear Issues and L Knee Edema  Patient here to follow-up left ear otitis media with no sign of rupture of tympanic membrane on 04/23/2015 at that time he was described azithromycin as he also had concurrent sore throat. He called back on 224 with continued ear pain and some vertigo-like symptoms I recommended that he use Sudafed. He tried Sudafed for a few days as well as some Tylenol sinus and congestion medicine. He still has some popping discomfort in his left ear he does not feel like it cleared up completely.  The past 4 days he's had swelling in his left knee. He does not recall any particular injury. He works in [employer] and he states that he was on his knees on Friday putting up some type of plastic barrier which is something he does not typically do but he did not have any pain or discomfort while he was doing it. He did use some type of topical anti-inflammatory and the swelling has gone down significantly the past few days.      Review Of Systems:  GEN- denies fatigue, fever, weight loss,weakness, recent illness HEENT- denies eye drainage, change in vision, nasal discharge, CVS- denies chest pain, palpitations RESP- denies SOB, cough, wheeze ABD- denies N/V, change in stools, abd pain GU- denies dysuria, hematuria, dribbling, incontinence MSK- +joint pain, muscle aches, injury Neuro- denies headache, dizziness, syncope, seizure activity       Objective:    BP 128/70 mmHg  Pulse 68  Temp(Src) 98.3 F (36.8 C) (Oral)  Resp 14  Ht 6' (1.829 m)  Wt 178 lb (80.74 kg)  BMI 24.14 kg/m2 GEN- NAD, alert and oriented x3 HEENT- PERRL, EOMI, non injected sclera, pink conjunctiva, MMM, oropharynx clear, Right canal and TM clear, Left TM injected, no fluid, canal clear, no bulge   Neck- Supple, no LAD  CVS- RRR, no murmur RESP-CTAB MSK- Right knee- normal inspection FROM, left knee- mild effusion inferior knee, NT, Good ROM, ligaments in tact, normal weight bearing EXT- No edema Pulses- Radial,  2+        Assessment & Plan:      Problem List Items Addressed This Visit    None      Note: This dictation was prepared with Dragon dictation along with smaller phrase technology. Any transcriptional errors that result from this process are unintentional.

## 2015-06-24 ENCOUNTER — Telehealth: Payer: Self-pay | Admitting: Family Medicine

## 2015-06-24 DIAGNOSIS — M7989 Other specified soft tissue disorders: Secondary | ICD-10-CM

## 2015-06-24 NOTE — Telephone Encounter (Signed)
Send for xray of right knee

## 2015-06-24 NOTE — Telephone Encounter (Signed)
Call placed to patient to inquire.   Reports that edema did resolve during 2 week trial of Mobic. Reports that once he stopped taking Mobic, edema returned. States that L knee is swollen and stiff, but is not painful. States that he has resumed taking Mobic, but edema continues.   MD please advise.

## 2015-06-24 NOTE — Telephone Encounter (Signed)
Call placed to patient and patient made aware.   X-ray Order placed for L knee.  Patient will obtain X-ray on 06/25/2015.

## 2015-06-24 NOTE — Telephone Encounter (Signed)
Patient calling to say that his knee is still swollen would like to know what he should do  Please call him at [phone]

## 2015-06-25 ENCOUNTER — Other Ambulatory Visit: Payer: Self-pay | Admitting: *Deleted

## 2015-06-25 ENCOUNTER — Ambulatory Visit (HOSPITAL_COMMUNITY)
Admission: RE | Admit: 2015-06-25 | Discharge: 2015-06-25 | Disposition: A | Discharge status: Home / Self Care | Payer: BLUE CROSS/BLUE SHIELD | Source: Ambulatory Visit | Attending: Family Medicine | Admitting: Family Medicine

## 2015-06-25 DIAGNOSIS — M7989 Other specified soft tissue disorders: Secondary | ICD-10-CM | POA: Diagnosis not present

## 2015-06-25 DIAGNOSIS — M25462 Effusion, left knee: Secondary | ICD-10-CM

## 2015-10-10 ENCOUNTER — Other Ambulatory Visit: Payer: Self-pay | Admitting: Family Medicine

## 2015-10-13 NOTE — Telephone Encounter (Signed)
Ok to refill??  Last office visit 06/03/2015.  Last refill Xanax 03/19/2015, #3 refills.   Last refill Restoril 03/19/2015, #5 refills.

## 2015-10-14 NOTE — Telephone Encounter (Signed)
Medication called to pharmacy. 

## 2015-10-14 NOTE — Telephone Encounter (Signed)
okay

## 2015-12-25 ENCOUNTER — Ambulatory Visit (INDEPENDENT_AMBULATORY_CARE_PROVIDER_SITE_OTHER): Payer: BLUE CROSS/BLUE SHIELD | Admitting: Physician Assistant

## 2015-12-25 ENCOUNTER — Encounter: Payer: Self-pay | Admitting: Physician Assistant

## 2015-12-25 VITALS — BP 120/80 | HR 69 | Temp 98.3°F | Resp 16 | Wt 166.0 lb

## 2015-12-25 DIAGNOSIS — J988 Other specified respiratory disorders: Secondary | ICD-10-CM | POA: Diagnosis not present

## 2015-12-25 DIAGNOSIS — B9689 Other specified bacterial agents as the cause of diseases classified elsewhere: Principal | ICD-10-CM

## 2015-12-25 MED ORDER — CEFDINIR 300 MG PO CAPS: 300.0000 mg | ORAL_CAPSULE | Freq: Two times a day (BID) | ORAL | 0 refills | Status: DC

## 2015-12-25 NOTE — Progress Notes (Signed)
    Patient ID: Billy Graham MRN: [id number], DOB: 1975-04-19, 40 y.o. Date of Encounter: 12/25/2015, 1:01 PM    Chief Complaint:  Chief Complaint  Patient presents with  . Otalgia    congested, left ear pain, muscle soreness going on about 2 weeks      HPI: 40 y.o. year old male presents with above.   Says that he this has been going on about 2 weeks. Says that he kept thinking that it would get better on its own--- so put off coming in--- but symptoms are not improving. Now has started to have some left ear ache. Also has been blowing a lot from his nose and also coughing up phlegm from his chest. Has had no fevers or chills. No other complaints or concerns.     Home Meds:   Outpatient Medications Prior to Visit  Medication Sig Dispense Refill  . ALPRAZolam (XANAX) 0.5 MG tablet TAKE 1 TABLET BY MOUTH TWICE DAILY AS NEEDED FOR ANXIETY 60 tablet 3  . FLUoxetine (PROZAC) 40 MG capsule Take 1 capsule (40 mg total) by mouth daily. 30 capsule 11  . temazepam (RESTORIL) 30 MG capsule TAKE 1 CAPSULE BY MOUTH EVERY NIGHT AT BEDTIME 30 capsule 5  . cefdinir (OMNICEF) 300 MG capsule Take 1 capsule (300 mg total) by mouth 2 (two) times daily. (Patient not taking: Reported on 12/25/2015) 14 capsule 0  . meloxicam (MOBIC) 7.5 MG tablet Take 1 tablet (7.5 mg total) by mouth daily. 30 tablet 0   No facility-administered medications prior to visit.     Allergies:  Allergies  Allergen Reactions  . Penicillins     Can't remember reaction      Review of Systems: See HPI for pertinent ROS. All other ROS negative.    Physical Exam: Blood pressure 120/80, pulse 69, temperature 98.3 F (36.8 C), temperature source Oral, resp. rate 16, weight 166 lb (75.3 kg)., Body mass index is 22.51 kg/m. General:  WNWD AAM. Appears in no acute distress. HEENT: Normocephalic, atraumatic, eyes without discharge, sclera non-icteric, nares are without discharge. Bilateral auditory canals clear. Right TM  normal. Left TM with erythema. No perforation.  Oral cavity moist, posterior pharynx without exudate, erythema, peritonsillar abscess. Neck: Supple. No thyromegaly. No lymphadenopathy. Lungs: Clear bilaterally to auscultation without wheezes, rales, or rhonchi. Breathing is unlabored. Heart: Regular rhythm. No murmurs, rubs, or gallops. Msk:  Strength and tone normal for age. Extremities/Skin: Warm and dry.Neuro: Alert and oriented X 3. Moves all extremities spontaneously. Gait is normal. CNII-XII grossly in tact. Psych:  Responds to questions appropriately with a normal affect.     ASSESSMENT AND PLAN:  40 y.o. year old male with  1. Bacterial respiratory infection He states in the past Z-Pak did not work for him. I reviewed his chart from visits in 05/2015. At that time he was changed to [facility] and that worked so will use Omnicef at this time. Take the [facility] as directed and complete all of it. Follow-up if symptoms do not resolve after completion of his antibiotic. - cefdinir (OMNICEF) 300 MG capsule; Take 1 capsule (300 mg total) by mouth 2 (two) times daily.  Dispense: 14 capsule; Refill: 0   Signed, [address], [place], [facility] 12/25/2015 1:01 PM

## 2016-02-04 ENCOUNTER — Telehealth: Payer: Self-pay | Admitting: *Deleted

## 2016-02-04 NOTE — Telephone Encounter (Signed)
Received call from patient.   Reports that he has been seen for L ear pain in Sept. States that he was given Omnicef for viral URI. States that he completed ABTx and pain lessened.   Reports that pain has returned to L ear and now has moved to R ear as well. Reports that he is also having some hearing loss in B ears due to pressure.   Patient states that he is [facility] working [facility] [employer]. [facility]

## 2016-03-26 ENCOUNTER — Other Ambulatory Visit: Payer: Self-pay | Admitting: Family Medicine

## 2016-03-26 NOTE — Telephone Encounter (Signed)
Okay to refill? 

## 2016-03-26 NOTE — Telephone Encounter (Signed)
Medication refilled per protocol. 

## 2016-03-26 NOTE — Telephone Encounter (Signed)
Ok to refill??  Last office visit 12/25/2015.  Last refill 10/14/2015, #3 refills.

## 2016-05-23 ENCOUNTER — Other Ambulatory Visit: Payer: Self-pay | Admitting: Family Medicine

## 2016-05-24 NOTE — Telephone Encounter (Signed)
Okay to refill? 

## 2016-05-24 NOTE — Telephone Encounter (Signed)
Ok to refill??  Last office visit 12/25/2015.  Last refill 10/14/2015, #5 refill.

## 2016-05-25 NOTE — Telephone Encounter (Signed)
Medication called to pharmacy. 

## 2016-07-07 ENCOUNTER — Encounter: Payer: Self-pay | Admitting: Family Medicine

## 2016-08-03 ENCOUNTER — Encounter: Payer: Self-pay | Admitting: Family Medicine

## 2016-09-21 ENCOUNTER — Other Ambulatory Visit: Payer: Self-pay | Admitting: Family Medicine

## 2016-09-21 NOTE — Telephone Encounter (Signed)
Ok to refill??  Last office visit 12/25/2015.  Last refill 03/26/2016, #2 refills.   Of note, FYI from 08/31/2016: [facility] Do not schedule until payment or arrangements are made.

## 2016-09-21 NOTE — Telephone Encounter (Signed)
Due to balance/dismissal He does not take on regular basis, advise if taking recently decreaese 1/2 tablet BID for 2 weeks, then 1/2 tablet daily x 2 weeks, #20 tablets

## 2016-09-22 NOTE — Telephone Encounter (Signed)
Medication called to pharmacy.  Call placed to all numbers in chart. All numbers are out of order.

## 2017-01-16 IMAGING — CR DG KNEE COMPLETE 4+V*L*
4 series · 4 of 4 positions shown · non-contrast
Comparison: None in PACs

CLINICAL DATA: Anterior lateral left knee pain for the past 3 weeks
without known injury

EXAM:
LEFT KNEE - COMPLETE 4+ VIEW

[t knee ap left]
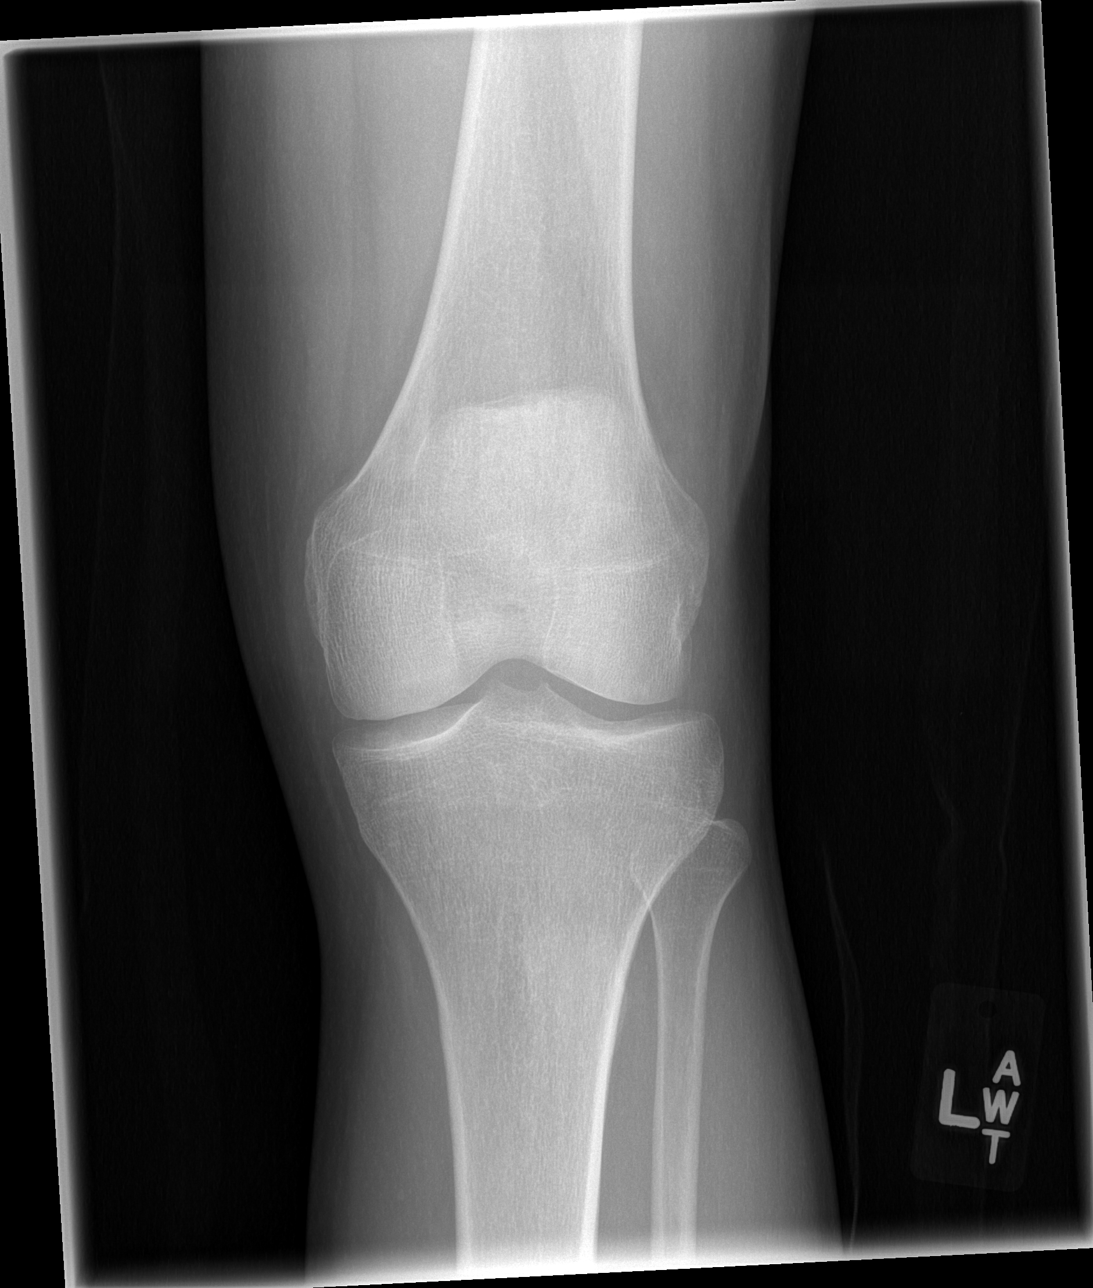

[t knee oblique left (1 of 2)]
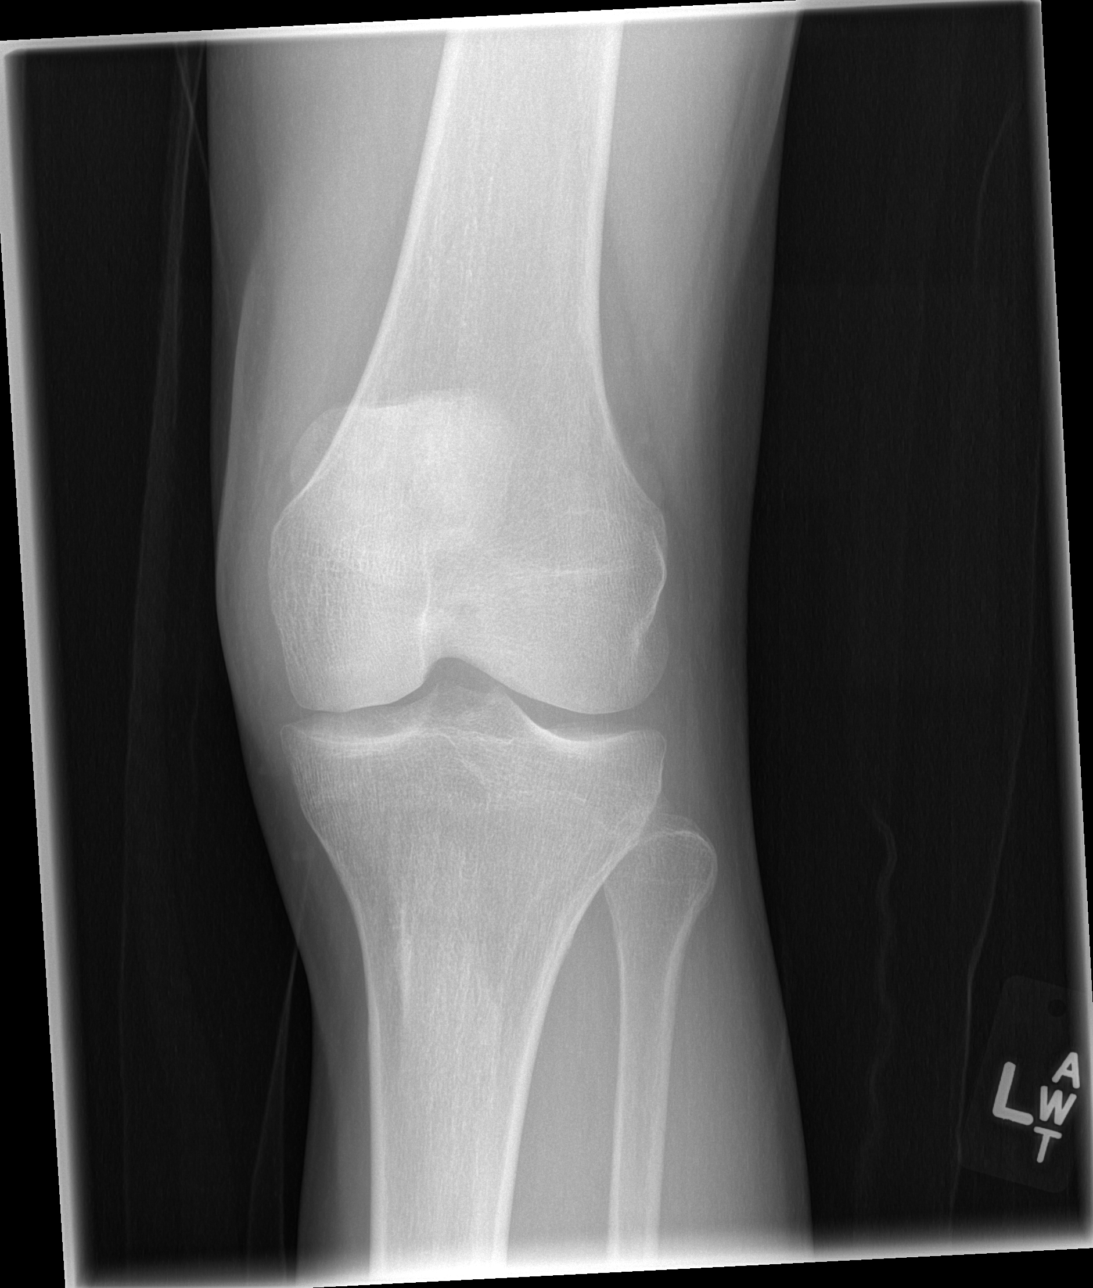

[t knee oblique left (2 of 2)]
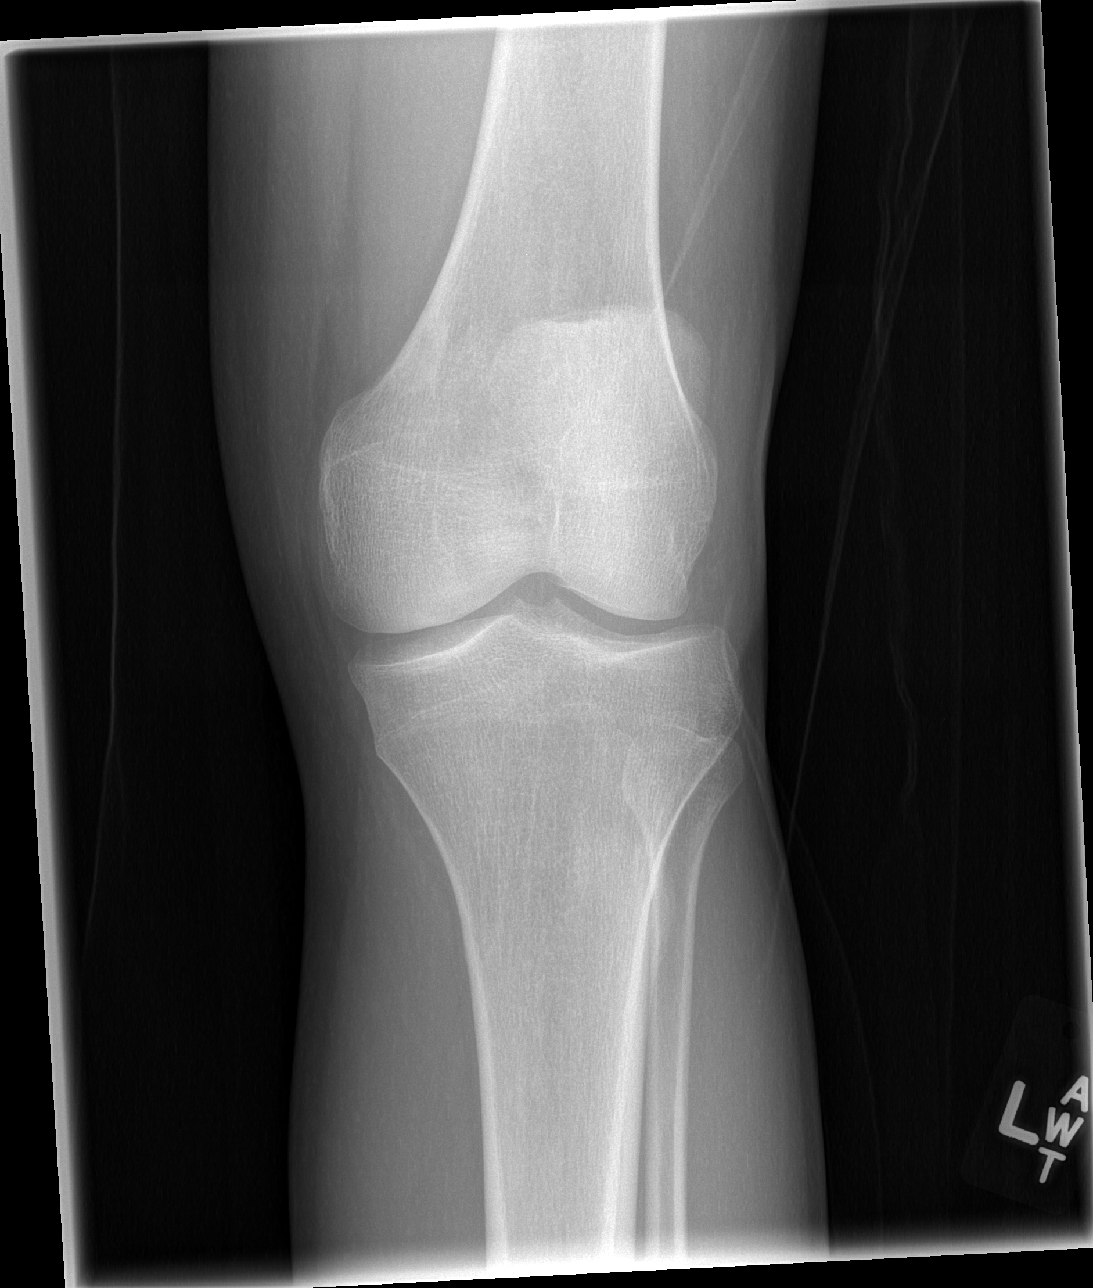

[t knee lat left]
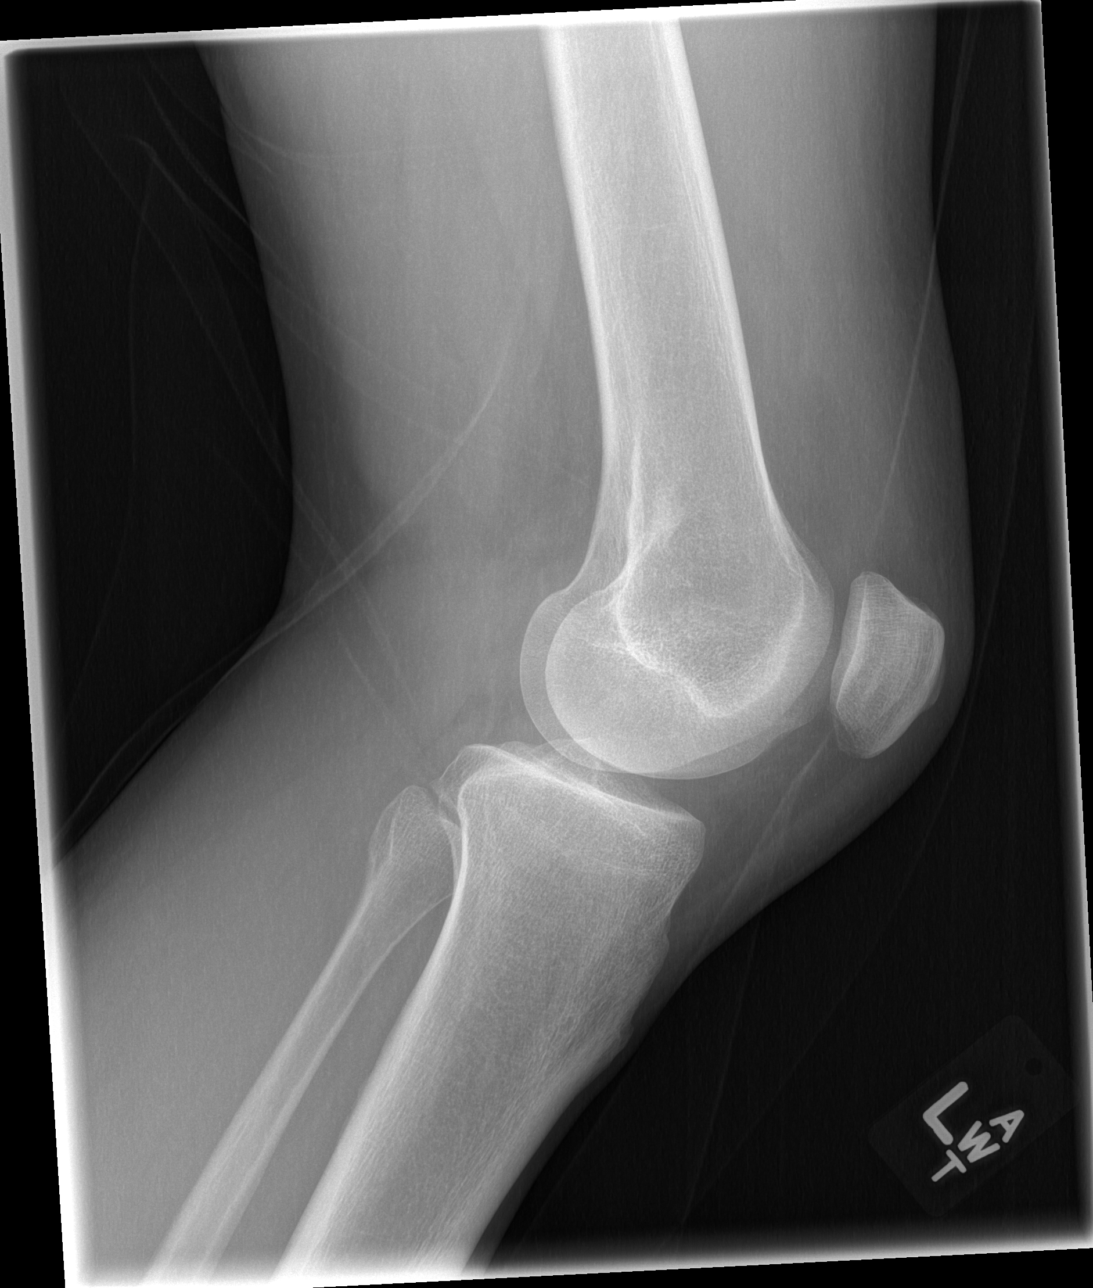

[4 of 4 positions shown; findings below may reference images not displayed]

FINDINGS: The bones are adequately mineralized. The joint spaces are
preserved. There is minimal beaking of the tibial spines. There is
no chondrocalcinosis or joint effusion.
IMPRESSION: There is no acute or significant chronic bony abnormality of the
left knee.

## 2020-02-12 ENCOUNTER — Ambulatory Visit: Payer: BLUE CROSS/BLUE SHIELD | Admitting: Family Medicine

## 2020-02-19 ENCOUNTER — Ambulatory Visit (INDEPENDENT_AMBULATORY_CARE_PROVIDER_SITE_OTHER): Payer: BC Managed Care – PPO | Admitting: Family Medicine

## 2020-02-19 ENCOUNTER — Other Ambulatory Visit: Payer: Self-pay

## 2020-02-19 ENCOUNTER — Encounter: Payer: Self-pay | Admitting: Family Medicine

## 2020-02-19 VITALS — BP 130/74 | HR 94 | Temp 99.4°F | Resp 14 | Ht 72.0 in | Wt 182.0 lb

## 2020-02-19 DIAGNOSIS — Z1159 Encounter for screening for other viral diseases: Secondary | ICD-10-CM

## 2020-02-19 DIAGNOSIS — Z0001 Encounter for general adult medical examination with abnormal findings: Secondary | ICD-10-CM

## 2020-02-19 DIAGNOSIS — F411 Generalized anxiety disorder: Secondary | ICD-10-CM | POA: Diagnosis not present

## 2020-02-19 DIAGNOSIS — F5101 Primary insomnia: Secondary | ICD-10-CM

## 2020-02-19 DIAGNOSIS — F17200 Nicotine dependence, unspecified, uncomplicated: Secondary | ICD-10-CM

## 2020-02-19 DIAGNOSIS — Z0000 Encounter for general adult medical examination without abnormal findings: Secondary | ICD-10-CM

## 2020-02-19 LAB — CBC WITH DIFFERENTIAL/PLATELET
Basophils Relative: 0.4 %
Hemoglobin: 14.3 g/dL (ref 13.2–17.1)
Neutrophils Relative %: 42.6 %
RDW: 13.7 % (ref 11.0–15.0)
WBC: 5.5 10*3/uL (ref 3.8–10.8)

## 2020-02-19 MED ORDER — FLUOXETINE HCL 20 MG PO CAPS: 20.0000 mg | ORAL_CAPSULE | Freq: Every day | ORAL | 3 refills | Status: AC

## 2020-02-19 MED ORDER — TEMAZEPAM 15 MG PO CAPS: 15.0000 mg | ORAL_CAPSULE | Freq: Every evening | ORAL | 1 refills | Status: AC | PRN

## 2020-02-19 NOTE — Assessment & Plan Note (Signed)
CPE done, fasting labs obtained

## 2020-02-19 NOTE — Assessment & Plan Note (Addendum)
Continues to smoke, does not tolerate Chantix and Wellbutrin   I think the cough the mucus clearing is related to his tobacco use.  He can use Mucinex as needed but cutting back on tobacco will prevent him from developing COPD.

## 2020-02-19 NOTE — Patient Instructions (Signed)
F/U January for medications

## 2020-02-19 NOTE — Assessment & Plan Note (Signed)
Restart restoril 15mg  once a day

## 2020-02-19 NOTE — Progress Notes (Signed)
   Subjective:    Patient ID: Billy Graham, male    DOB: 1975-12-13, 44 y.o.   MRN: [id number]  Patient presents for Re-Establish Care (is fasting)   Pt here for CPE, he has not seen a doctor in the past 4 years  He has not seen Dentist or Eye Doctor  .  He has history of anxiety and depression but his anxiety has been worsening the past 2 months.    He works in [employer] and is overseeing a bunch of people and feels the [employer] of his job He has not been able to sleep well and has turned to ETOH some nights or OTC sleep aides He was on Prozac and Restoril and these worked well for him   He continues to smoke 1ppd   COVID vaccine done    Family history updated   He does get cough with some phlegm first thing in AM, no SOB    Review Of Systems:  GEN- denies fatigue, fever, weight loss,weakness, recent illness HEENT- denies eye drainage, change in vision, nasal discharge, CVS- denies chest pain, palpitations RESP- denies SOB, cough, wheeze ABD- denies N/V, change in stools, abd pain GU- denies dysuria, hematuria, dribbling, incontinence MSK- denies joint pain, muscle aches, injury Neuro- denies headache, dizziness, syncope, seizure activity       Objective:    BP 130/74   Pulse 94   Temp 99.4 F (37.4 C) (Temporal)   Resp 14   Ht 6' (1.829 m)   Wt 182 lb (82.6 kg)   SpO2 97%   BMI 24.68 kg/m  GEN- NAD, alert and oriented x3 HEENT- PERRL, EOMI, non injected sclera, pink conjunctiva, MMM, oropharynx clear , TM clear no effusion , mild nasal congestion  Neck- Supple, no thyromegaly CVS- RRR, no murmur RESP-CTAB ABD-NABS,soft,NT,ND Psych normal affect and mood  EXT- No edema Pulses- Radial, DP- 2+   FALL/DEPRESSION/AUDIT C neg      Assessment & Plan:   RTC in 6-8 weeks for meds , he is unable to come in early December due to job    Problem List Items Addressed This Visit      Unprioritized   GAD (generalized anxiety disorder)    Restart prozac 20mg   once a day       Relevant Medications   FLUoxetine (PROZAC) 20 MG capsule   Insomnia    Restart restoril 15mg  once a day       Routine general medical examination at a health care facility - Primary    CPE done, fasting labs obtained       Relevant Orders   TSH   CBC with Differential/Platelet   Comprehensive metabolic panel   Lipid panel   Tobacco use disorder    Continues to smoke, does not tolerate Chantix and Wellbutrin   I think the cough the mucus clearing is related to his tobacco use.  He can use Mucinex as needed but cutting back on tobacco will prevent him from developing COPD.       Other Visit Diagnoses    Need for hepatitis C screening test       Relevant Orders   Hepatitis C antibody      Note: This dictation was prepared with Dragon dictation along with smaller phrase technology. Any transcriptional errors that result from this process are unintentional.

## 2020-02-19 NOTE — Assessment & Plan Note (Signed)
Restart prozac 20mg  once a day

## 2020-02-20 ENCOUNTER — Encounter: Payer: Self-pay | Admitting: *Deleted

## 2020-02-20 LAB — CBC WITH DIFFERENTIAL/PLATELET
Absolute Monocytes: 578 cells/uL (ref 200–950)
Basophils Absolute: 22 cells/uL (ref 0–200)
Eosinophils Absolute: 275 cells/uL (ref 15–500)
Eosinophils Relative: 5 %
HCT: 42.1 % (ref 38.5–50.0)
Lymphs Abs: 2283 cells/uL (ref 850–3900)
MCH: 30.8 pg (ref 27.0–33.0)
MCHC: 34 g/dL (ref 32.0–36.0)
MCV: 90.5 fL (ref 80.0–100.0)
MPV: 10.8 fL (ref 7.5–12.5)
Monocytes Relative: 10.5 %
Neutro Abs: 2343 cells/uL (ref 1500–7800)
Platelets: 220 10*3/uL (ref 140–400)
RBC: 4.65 10*6/uL (ref 4.20–5.80)
Total Lymphocyte: 41.5 %

## 2020-02-20 LAB — COMPREHENSIVE METABOLIC PANEL
AG Ratio: 2 (calc) (ref 1.0–2.5)
ALT: 26 U/L (ref 9–46)
AST: 21 U/L (ref 10–40)
Albumin: 4.6 g/dL (ref 3.6–5.1)
Alkaline phosphatase (APISO): 54 U/L (ref 36–130)
BUN: 7 mg/dL (ref 7–25)
CO2: 21 mmol/L (ref 20–32)
Calcium: 9.4 mg/dL (ref 8.6–10.3)
Chloride: 107 mmol/L (ref 98–110)
Creat: 0.89 mg/dL (ref 0.60–1.35)
Globulin: 2.3 g/dL (calc) (ref 1.9–3.7)
Glucose, Bld: 78 mg/dL (ref 65–99)
Potassium: 4.6 mmol/L (ref 3.5–5.3)
Sodium: 141 mmol/L (ref 135–146)
Total Bilirubin: 0.3 mg/dL (ref 0.2–1.2)
Total Protein: 6.9 g/dL (ref 6.1–8.1)

## 2020-02-20 LAB — LIPID PANEL
Cholesterol: 189 mg/dL (ref <200)
HDL: 72 mg/dL (ref >=40)
LDL Cholesterol (Calc): 101 mg/dL (calc) — ABNORMAL HIGH
Non-HDL Cholesterol (Calc): 117 mg/dL (calc) (ref <130)
Total CHOL/HDL Ratio: 2.6 (calc) (ref <5.0)
Triglycerides: 69 mg/dL (ref <150)

## 2020-02-20 LAB — HEPATITIS C ANTIBODY
Hepatitis C Ab: NONREACTIVE
SIGNAL TO CUT-OFF: 0.01 (ref <1.00)

## 2020-02-20 LAB — TSH: TSH: 0.87 mIU/L (ref 0.40–4.50)
